# Patient Record
Sex: Female | Born: 1995 | Race: White | Hispanic: Yes | Marital: Single
Health system: Southern US, Community
[De-identification: ages and names within clinical notes are randomized; demographics above are authoritative.]

---

## 2014-07-21 ENCOUNTER — Other Ambulatory Visit (HOSPITAL_COMMUNITY): Payer: Self-pay | Admitting: General Surgery

## 2014-07-21 DIAGNOSIS — G44021 Chronic cluster headache, intractable: Secondary | ICD-10-CM

## 2014-07-26 ENCOUNTER — Other Ambulatory Visit (HOSPITAL_COMMUNITY): Payer: Self-pay

## 2014-07-26 ENCOUNTER — Ambulatory Visit (HOSPITAL_COMMUNITY): Payer: Self-pay

## 2014-07-28 ENCOUNTER — Ambulatory Visit (HOSPITAL_COMMUNITY)
Admission: RE | Admit: 2014-07-28 | Discharge: 2014-07-28 | Disposition: A | Discharge status: Home / Self Care | Payer: No Typology Code available for payment source | Source: Ambulatory Visit | Attending: General Surgery | Admitting: General Surgery

## 2014-07-28 DIAGNOSIS — M542 Cervicalgia: Secondary | ICD-10-CM | POA: Diagnosis not present

## 2014-07-28 DIAGNOSIS — G44021 Chronic cluster headache, intractable: Secondary | ICD-10-CM

## 2014-07-28 DIAGNOSIS — R51 Headache: Secondary | ICD-10-CM | POA: Insufficient documentation
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
## 2019-08-09 ENCOUNTER — Other Ambulatory Visit: Payer: Self-pay | Admitting: *Deleted

## 2019-08-09 DIAGNOSIS — Z20822 Contact with and (suspected) exposure to covid-19: Secondary | ICD-10-CM

## 2019-08-10 LAB — NOVEL CORONAVIRUS, NAA: SARS-CoV-2, NAA: NOT DETECTED

## 2019-09-28 ENCOUNTER — Other Ambulatory Visit: Payer: Self-pay
